# Patient Record
Sex: Female | Born: 1975
Health system: Southern US, Community
[De-identification: ages and names within clinical notes are randomized; demographics above are authoritative.]

## PROBLEM LIST (undated history)

## (undated) DIAGNOSIS — R42 Dizziness and giddiness: Secondary | ICD-10-CM

---

## 2014-06-15 ENCOUNTER — Other Ambulatory Visit (HOSPITAL_COMMUNITY)
Admission: RE | Admit: 2014-06-15 | Discharge: 2014-06-15 | Disposition: A | Discharge status: Home / Self Care | Payer: BC Managed Care – PPO | Source: Ambulatory Visit | Attending: Family Medicine | Admitting: Family Medicine

## 2014-06-15 DIAGNOSIS — Z01411 Encounter for gynecological examination (general) (routine) with abnormal findings: Secondary | ICD-10-CM | POA: Insufficient documentation

## 2014-06-15 DIAGNOSIS — Z1151 Encounter for screening for human papillomavirus (HPV): Secondary | ICD-10-CM | POA: Diagnosis present

## 2017-05-24 ENCOUNTER — Emergency Department (HOSPITAL_COMMUNITY): Payer: BC Managed Care – PPO

## 2017-05-24 ENCOUNTER — Emergency Department (HOSPITAL_COMMUNITY)
Admission: EM | Admit: 2017-05-24 | Discharge: 2017-05-24 | Disposition: A | Discharge status: Home / Self Care | Payer: BC Managed Care – PPO | Attending: Emergency Medicine | Admitting: Emergency Medicine

## 2017-05-24 ENCOUNTER — Encounter (HOSPITAL_COMMUNITY): Payer: Self-pay | Admitting: *Deleted

## 2017-05-24 DIAGNOSIS — R112 Nausea with vomiting, unspecified: Secondary | ICD-10-CM | POA: Diagnosis not present

## 2017-05-24 DIAGNOSIS — R197 Diarrhea, unspecified: Secondary | ICD-10-CM | POA: Insufficient documentation

## 2017-05-24 DIAGNOSIS — R1030 Lower abdominal pain, unspecified: Secondary | ICD-10-CM | POA: Diagnosis present

## 2017-05-24 HISTORY — DX: Dizziness and giddiness: R42

## 2017-05-24 LAB — URINALYSIS, ROUTINE W REFLEX MICROSCOPIC
BILIRUBIN URINE: NEGATIVE
Glucose, UA: NEGATIVE mg/dL
HGB URINE DIPSTICK: NEGATIVE
Ketones, ur: 20 mg/dL — AB
Leukocytes, UA: NEGATIVE
Nitrite: NEGATIVE
Protein, ur: NEGATIVE mg/dL
SPECIFIC GRAVITY, URINE: 1.03 (ref 1.005–1.030)
pH: 7 (ref 5.0–8.0)

## 2017-05-24 LAB — CBC
HCT: 41.2 % (ref 36.0–46.0)
HEMOGLOBIN: 14.2 g/dL (ref 12.0–15.0)
MCH: 30.9 pg (ref 26.0–34.0)
MCHC: 34.5 g/dL (ref 30.0–36.0)
MCV: 89.8 fL (ref 78.0–100.0)
Platelets: 275 10*3/uL (ref 150–400)
RBC: 4.59 MIL/uL (ref 3.87–5.11)
RDW: 13 % (ref 11.5–15.5)
WBC: 6.5 10*3/uL (ref 4.0–10.5)

## 2017-05-24 LAB — COMPREHENSIVE METABOLIC PANEL
ALK PHOS: 56 U/L (ref 38–126)
ALT: 21 U/L (ref 14–54)
ANION GAP: 9 (ref 5–15)
AST: 21 U/L (ref 15–41)
Albumin: 4.1 g/dL (ref 3.5–5.0)
BUN: 6 mg/dL (ref 6–20)
CALCIUM: 9 mg/dL (ref 8.9–10.3)
CO2: 25 mmol/L (ref 22–32)
Chloride: 103 mmol/L (ref 101–111)
Creatinine, Ser: 0.95 mg/dL (ref 0.44–1.00)
GFR calc Af Amer: >60 mL/min (ref >60)
GFR calc non Af Amer: >60 mL/min (ref >60)
GLUCOSE: 108 mg/dL — AB (ref 65–99)
Potassium: 3.4 mmol/L — ABNORMAL LOW (ref 3.5–5.1)
SODIUM: 137 mmol/L (ref 135–145)
Total Bilirubin: 0.6 mg/dL (ref 0.3–1.2)
Total Protein: 7.1 g/dL (ref 6.5–8.1)

## 2017-05-24 LAB — RAPID URINE DRUG SCREEN, HOSP PERFORMED
AMPHETAMINES: NOT DETECTED
BARBITURATES: NOT DETECTED
BENZODIAZEPINES: NOT DETECTED
Cocaine: NOT DETECTED
Opiates: NOT DETECTED
Tetrahydrocannabinol: NOT DETECTED

## 2017-05-24 LAB — WET PREP, GENITAL
Sperm: NONE SEEN
Trich, Wet Prep: NONE SEEN
YEAST WET PREP: NONE SEEN

## 2017-05-24 LAB — I-STAT BETA HCG BLOOD, ED (MC, WL, AP ONLY)

## 2017-05-24 LAB — LIPASE, BLOOD: Lipase: 34 U/L (ref 11–51)

## 2017-05-24 MED ORDER — DICYCLOMINE HCL 10 MG PO CAPS
10.0000 mg | ORAL_CAPSULE | Freq: Once | ORAL | Status: AC
  Administered 2017-05-24: 10 mg via ORAL
  Filled 2017-05-24: qty 1

## 2017-05-24 MED ORDER — IOPAMIDOL (ISOVUE-300) INJECTION 61%
INTRAVENOUS | Status: AC
Start: 2017-05-24 — End: 2017-05-24
  Administered 2017-05-24: 100 mL
  Filled 2017-05-24: qty 100

## 2017-05-24 MED ORDER — SODIUM CHLORIDE 0.9 % IV SOLN: 1000.0000 mL | INTRAVENOUS | Status: DC

## 2017-05-24 MED ORDER — DICYCLOMINE HCL 20 MG PO TABS: 20.0000 mg | ORAL_TABLET | Freq: Two times a day (BID) | ORAL | 0 refills | Status: AC

## 2017-05-24 MED ORDER — SODIUM CHLORIDE 0.9 % IV BOLUS (SEPSIS)
1000.0000 mL | Freq: Once | INTRAVENOUS | Status: AC
  Administered 2017-05-24: 1000 mL via INTRAVENOUS

## 2017-05-24 MED ORDER — PROMETHAZINE HCL 25 MG/ML IJ SOLN
25.0000 mg | Freq: Once | INTRAMUSCULAR | Status: AC
  Administered 2017-05-24: 25 mg via INTRAVENOUS
  Filled 2017-05-24: qty 1

## 2017-05-24 MED ORDER — ONDANSETRON 4 MG PO TBDP
4.0000 mg | ORAL_TABLET | Freq: Once | ORAL | Status: AC
  Administered 2017-05-24: 4 mg via ORAL
  Filled 2017-05-24: qty 1

## 2017-05-24 MED ORDER — KETOROLAC TROMETHAMINE 30 MG/ML IJ SOLN
30.0000 mg | Freq: Once | INTRAMUSCULAR | Status: AC
  Administered 2017-05-24: 30 mg via INTRAVENOUS
  Filled 2017-05-24: qty 1

## 2017-05-24 MED ORDER — METOCLOPRAMIDE HCL 10 MG PO TABS: 10.0000 mg | ORAL_TABLET | Freq: Three times a day (TID) | ORAL | 0 refills | Status: AC

## 2017-05-24 NOTE — ED Notes (Addendum)
Pt removing clothing for pelvic exam.  Pelvic cart set up outside of room.

## 2017-05-24 NOTE — ED Triage Notes (Signed)
Pt reports lower abdominal pain that has been ongoing since Monday. Pt states that pain is intermittent. Pt states that she has had N/V/D as well.

## 2017-05-24 NOTE — ED Notes (Signed)
Pt reports 4 episodes of vomiting today and no episodes of diarrhea in the last 24 hours.

## 2017-05-24 NOTE — ED Provider Notes (Signed)
[facility] EMERGENCY DEPARTMENT Provider Note   CSN: [id number] Arrival date & time: 05/24/17  4098     History   Chief Complaint Chief Complaint  Patient presents with  . Abdominal Pain    HPI Danielle Allison is a 42 y.o. female.  HPI Patient has had 3 days of lower abdominal pain waxing and waning severity.  She reports when it comes on is intense and stabbing with cramping.  She reports it seems somewhat better in the morning and then by evening she is having severe pain.  She has had 2 episodes of loose stool.  She has had ongoing nausea.  She reports since last night she is vomited multiple times.  She reports this happened once about a month ago but resolved spontaneously over time.  She did not seek treatment at that time.  Denies any pain burning urgency with urination.  She reports she is currently having a menstrual cycle.  It is normal mental cycle she denies other vaginal discharge.  No fevers no chills.  No sick contacts.  No recent travel.  Pain is central and suprapubic. Past Medical History:  Diagnosis Date  . Vertigo     There are no active problems to display for this patient.   History reviewed. No pertinent surgical history.  OB History    No data available       Home Medications    Prior to Admission medications   Medication Sig Start Date End Date Taking? Authorizing Provider  dicyclomine (BENTYL) 20 MG tablet Take 1 tablet (20 mg total) by mouth 2 (two) times daily. 05/24/17   Arby Barrette, MD  metoCLOPramide (REGLAN) 10 MG tablet Take 1 tablet (10 mg total) by mouth 3 (three) times daily before meals. 05/24/17   Arby Barrette, MD    Family History No family history on file.  Social History Social History   Tobacco Use  . Smoking status: Not on file  Substance Use Topics  . Alcohol use: Not on file  . Drug use: Not on file     Allergies   Patient has no known allergies.   Review of Systems Review of Systems 10  Systems reviewed and are negative for acute change except as noted in the HPI.   Physical Exam Updated Vital Signs BP (!) 138/91   Pulse 83   Temp 98.1 F (36.7 C)   Resp 17   LMP 05/18/2017   SpO2 99%   Physical Exam  Constitutional: She is oriented to person, place, and time.  Awake, alert, nontoxic appearance with baseline speech for patient.  HENT:  Head: Normocephalic and atraumatic.  Mouth/Throat: Oropharynx is clear and moist. No oropharyngeal exudate.  Eyes: EOM are normal. Pupils are equal, round, and reactive to light. Right eye exhibits no discharge. Left eye exhibits no discharge.  Neck: Neck supple.  Cardiovascular: Normal rate and regular rhythm.  No murmur heard. Pulmonary/Chest: Effort normal and breath sounds normal. No stridor. No respiratory distress. She has no wheezes. She has no rales. She exhibits no tenderness.  Abdominal: Soft. Bowel sounds are normal. She exhibits no distension and no mass. There is tenderness. There is no rebound.  Mild suprapubic discomfort to palpation.  No guarding.  No palpable mass.  Upper abdomen nontender.  Genitourinary: Vagina normal.  Genitourinary Comments: No discharge in the vaginal vault.  Cervix is nontender.  No pelvic tenderness to palpation.  Musculoskeletal: Normal range of motion. She exhibits no tenderness.  Baseline ROM,  moves extremities with no obvious new focal weakness.  Lymphadenopathy:    She has no cervical adenopathy.  Neurological: She is alert and oriented to person, place, and time. No cranial nerve deficit. She exhibits normal muscle tone. Coordination normal.  Skin: No rash noted.  Psychiatric: She has a normal mood and affect.  Nursing note and vitals reviewed.    ED Treatments / Results  Labs (all labs ordered are listed, but only abnormal results are displayed) Labs Reviewed  COMPREHENSIVE METABOLIC PANEL - Abnormal; Notable for the following components:      Result Value   Potassium 3.4 (*)     Glucose, Bld 108 (*)    All other components within normal limits  URINALYSIS, ROUTINE W REFLEX MICROSCOPIC - Abnormal; Notable for the following components:   Ketones, ur 20 (*)    All other components within normal limits  WET PREP, GENITAL  LIPASE, BLOOD  CBC  RAPID URINE DRUG SCREEN, HOSP PERFORMED  I-STAT BETA HCG BLOOD, ED ([facility], [facility], [facility] ONLY)  GC/CHLAMYDIA PROBE AMP ([place]) NOT AT [facility]    EKG  EKG Interpretation None       Radiology Ct Abdomen Pelvis W Contrast  Result Date: 05/24/2017 CLINICAL DATA:  Lower abdominal pain, 4 days duration. Pain is intermittent. Some nausea, vomiting and diarrhea. EXAM: CT ABDOMEN AND PELVIS WITH CONTRAST TECHNIQUE: Multidetector CT imaging of the abdomen and pelvis was performed using the standard protocol following bolus administration of intravenous contrast. CONTRAST:  ISOVUE-300 IOPAMIDOL (ISOVUE-300) INJECTION 61% COMPARISON:  None. FINDINGS: Lower chest: Normal Hepatobiliary: Liver parenchyma is normal.  No calcified gallstones. Pancreas: Normal Spleen: Normal Adrenals/Urinary Tract: Adrenal glands are normal. Kidneys are normal. No cyst, mass, stone or hydronephrosis. Collecting system contrast on the initial imaging would obscure small stones. Bladder appears normal. Stomach/Bowel: No abnormal bowel finding.  Normal appendix. Vascular/Lymphatic: Aorta and IVC are normal. No retroperitoneal adenopathy. Reproductive: Uterus and adnexal regions are normal. Other: No free fluid or air. Musculoskeletal: Normal IMPRESSION: Normal CT scan of the abdomen and pelvis. No abnormality seen to explain the presenting symptoms. Electronically Signed   By: Paulina Fusi M.D.   On: 05/24/2017 11:40    Procedures Procedures (including critical care time)  Medications Ordered in ED Medications  sodium chloride 0.9 % bolus 1,000 mL (0 mLs Intravenous Stopped 05/24/17 1303)    Followed by  sodium chloride 0.9 % bolus 1,000 mL (0 mLs Intravenous  Stopped 05/24/17 1303)    Followed by  0.9 %  sodium chloride infusion (not administered)  ondansetron (ZOFRAN-ODT) disintegrating tablet 4 mg (4 mg Oral Given 05/24/17 0809)  ketorolac (TORADOL) 30 MG/ML injection 30 mg (30 mg Intravenous Given 05/24/17 1041)  promethazine (PHENERGAN) injection 25 mg (25 mg Intravenous Given 05/24/17 1041)  iopamidol (ISOVUE-300) 61 % injection (100 mLs  Contrast Given 05/24/17 1100)  dicyclomine (BENTYL) capsule 10 mg (10 mg Oral Given 05/24/17 1442)     Initial Impression / Assessment and Plan / ED Course  I have reviewed the triage vital signs and the nursing notes.  Pertinent labs & imaging results that were available during my care of the patient were reviewed by me and considered in my medical decision making (see chart for details).     Final Clinical Impressions(s) / ED Diagnoses   Final diagnoses:  Lower abdominal pain  Non-intractable vomiting with nausea, unspecified vomiting type  Diagnostic workup is within normal limits.  Patient has had recurrence of lower abdominal cramping pain  and nausea and vomiting.  Clinically she is well in appearance.  CT and diagnostic studies do not show anomaly.  Consideration is given to possible irritable bowel.  Patient reports the Phenergan and Zofran that her provider had given her last time this happened has not been helpful.  We will have her trial Reglan and Bentyl for symptoms.  Recommend follow-up with her PCP and possibly gastroenterology.  ED Discharge Orders        Ordered    dicyclomine (BENTYL) 20 MG tablet  2 times daily     05/24/17 1604    metoCLOPramide (REGLAN) 10 MG tablet  3 times daily before meals     05/24/17 1604       Arby BarrettePfeiffer, Ariyel Jeangilles, MD 05/24/17 1610

## 2017-05-24 NOTE — ED Notes (Signed)
Patient transported to CT 

## 2017-05-24 NOTE — ED Notes (Signed)
Dr. Pfeiffer at bedside   

## 2017-05-24 NOTE — ED Notes (Signed)
Pt verbalized understanding of d/c instructions and has no further questions. VSS. NAD. Pt to follow up with pcp and possibly GI.

## 2017-05-24 NOTE — ED Notes (Signed)
Pt taken to room via wheelchair by this RN.

## 2017-05-24 NOTE — ED Notes (Signed)
Pt called out in pain. Dr. Donnald GarrePfeiffer notified.

## 2017-05-25 LAB — GC/CHLAMYDIA PROBE AMP (~~LOC~~) NOT AT ARMC
Chlamydia: NEGATIVE
Neisseria Gonorrhea: NEGATIVE

## 2017-07-11 ENCOUNTER — Other Ambulatory Visit (HOSPITAL_COMMUNITY)
Admission: RE | Admit: 2017-07-11 | Discharge: 2017-07-11 | Disposition: A | Discharge status: Home / Self Care | Payer: BC Managed Care – PPO | Source: Ambulatory Visit | Attending: Family Medicine | Admitting: Family Medicine

## 2017-07-11 ENCOUNTER — Other Ambulatory Visit: Payer: Self-pay | Admitting: Family Medicine

## 2017-07-11 DIAGNOSIS — Z124 Encounter for screening for malignant neoplasm of cervix: Secondary | ICD-10-CM | POA: Diagnosis not present

## 2017-07-13 LAB — CYTOLOGY - PAP
Diagnosis: NEGATIVE
HPV: NOT DETECTED

## 2018-12-03 ENCOUNTER — Ambulatory Visit: Payer: Self-pay | Admitting: *Deleted

## 2018-12-03 NOTE — Telephone Encounter (Signed)
My son has autism and a therapist comes in to work with him.   We just found out she is positive. I'm having symptoms and so is my husband. My doctor told me to call this number.   I let her know we needed an order from her doctor and we would call her and make an appt.    She goes to [facility].   I let her know that since they are not on our computer system that he could call in the order to [place] and we could take it over the phone and then get her set up.   She wants to all be tested at the same time if possible.   Her children go to a pediatrician who has ordered the test.  She is going to call her doctor now  And have him call in the order to [place].   She was agreeable to this.

## 2019-03-13 ENCOUNTER — Other Ambulatory Visit: Payer: Self-pay

## 2019-03-13 DIAGNOSIS — Z20822 Contact with and (suspected) exposure to covid-19: Secondary | ICD-10-CM

## 2019-03-15 LAB — NOVEL CORONAVIRUS, NAA: SARS-CoV-2, NAA: NOT DETECTED

## 2019-06-03 ENCOUNTER — Ambulatory Visit: Payer: BC Managed Care – PPO | Attending: Internal Medicine

## 2019-06-03 DIAGNOSIS — Z20822 Contact with and (suspected) exposure to covid-19: Secondary | ICD-10-CM

## 2019-06-04 ENCOUNTER — Other Ambulatory Visit: Payer: Self-pay

## 2019-06-05 ENCOUNTER — Other Ambulatory Visit: Payer: Self-pay

## 2019-06-05 LAB — NOVEL CORONAVIRUS, NAA: SARS-CoV-2, NAA: NOT DETECTED

## 2019-06-13 IMAGING — CT CT ABD-PELV W/ CM
2 of 5 series · 16 of 46 positions shown, 18 images · IV contrast (APPLIED)
Comparison: None.

CLINICAL DATA: Lower abdominal pain, 4 days duration. Pain is
intermittent. Some nausea, vomiting and diarrhea.

EXAM:
CT ABDOMEN AND PELVIS WITH CONTRAST
TECHNIQUE: Multidetector CT imaging of the abdomen and pelvis was performed
using the standard protocol following bolus administration of
intravenous contrast.
CONTRAST:  100mL BSVDEP-QII IOPAMIDOL (BSVDEP-QII) INJECTION 61%

[Series 3: abd/ pelvis 5.0 i30f 2 · axial · 0.77mm/px · z∈[+682,+1112]mm · 13 of 98 slices shown, 15 images]
[im 6/98  soft-tissue]
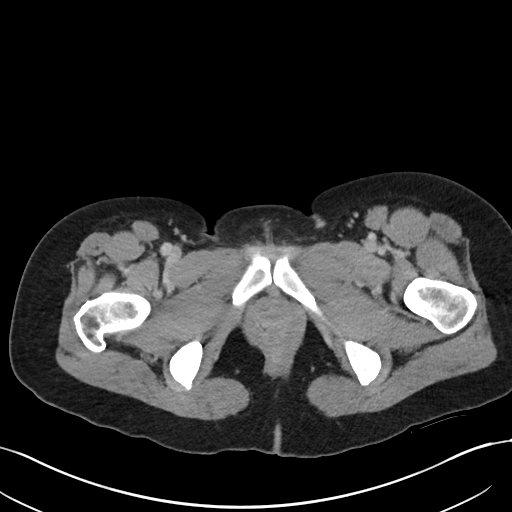
[im 6/98  bone]
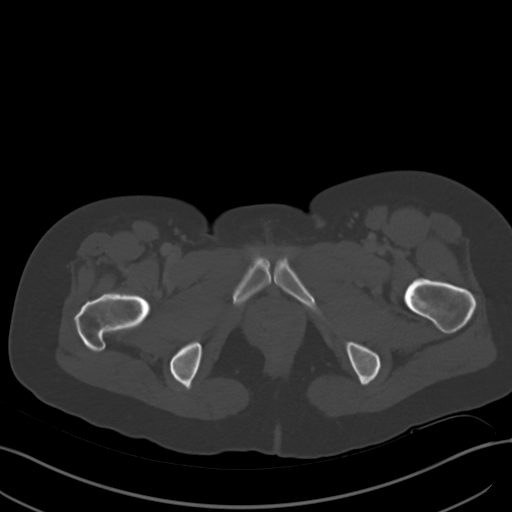
[im 16/98  soft-tissue]
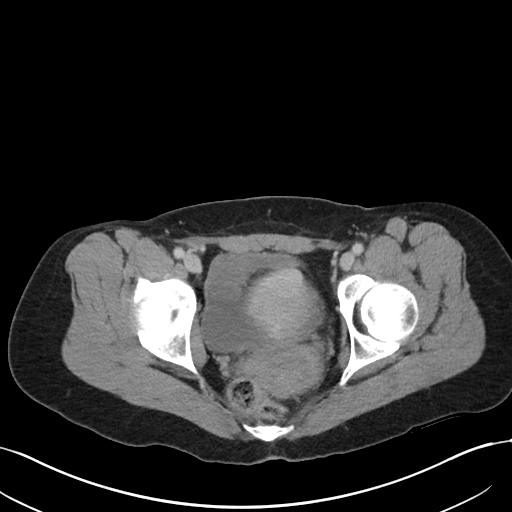
[im 21/98  soft-tissue]
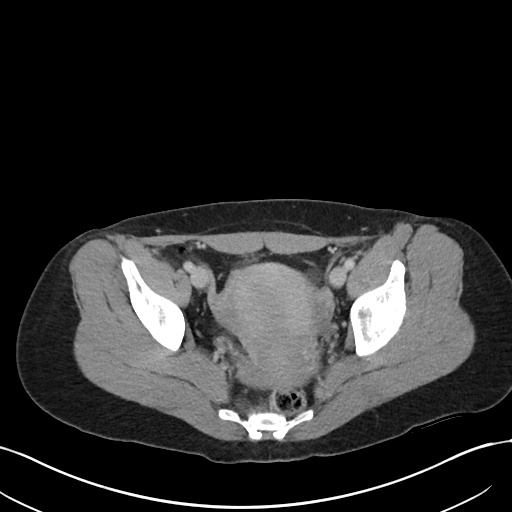
[im 26/98  soft-tissue]
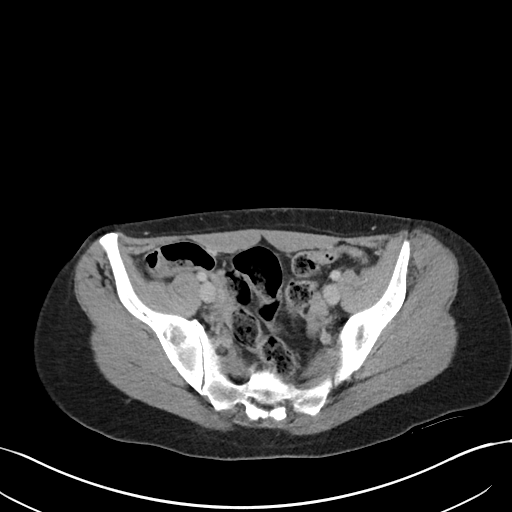
[im 36/98  soft-tissue]
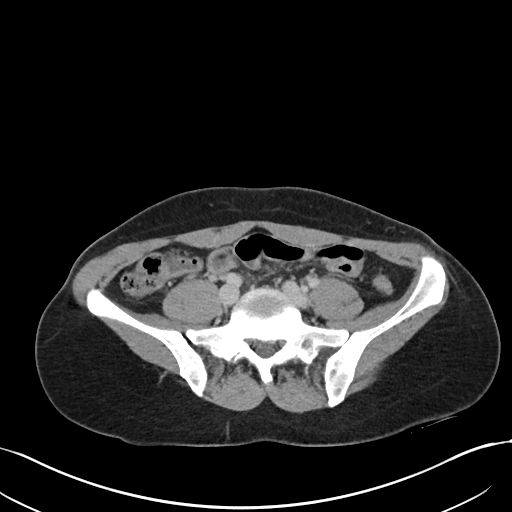
[im 41/98  soft-tissue]
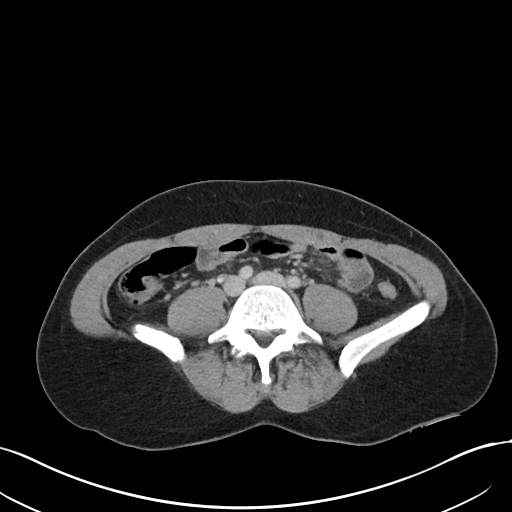
[im 52/98  soft-tissue]
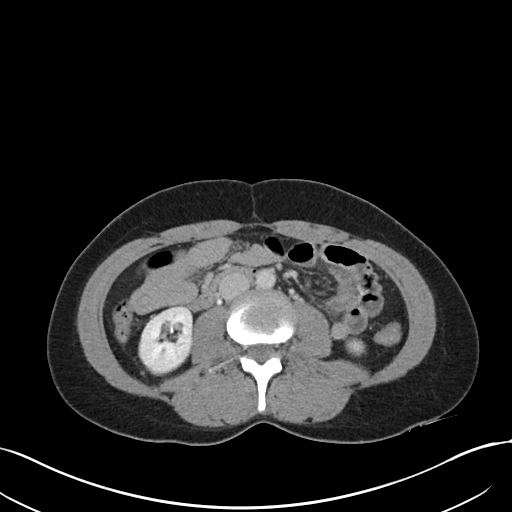
[im 57/98  soft-tissue]
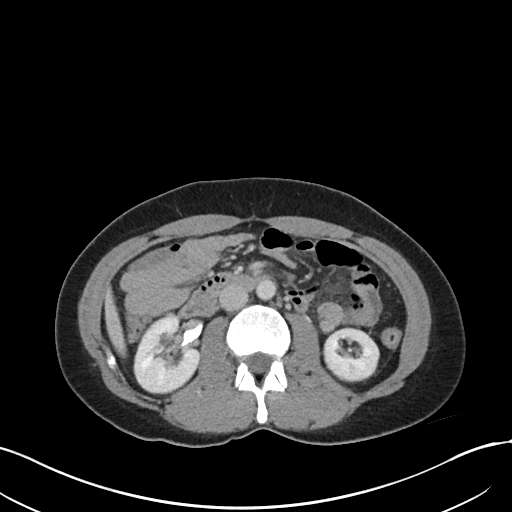
[im 62/98  soft-tissue]
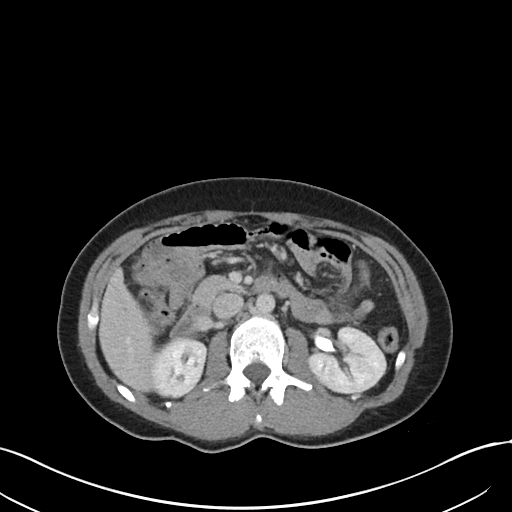
[im 62/98  bone]
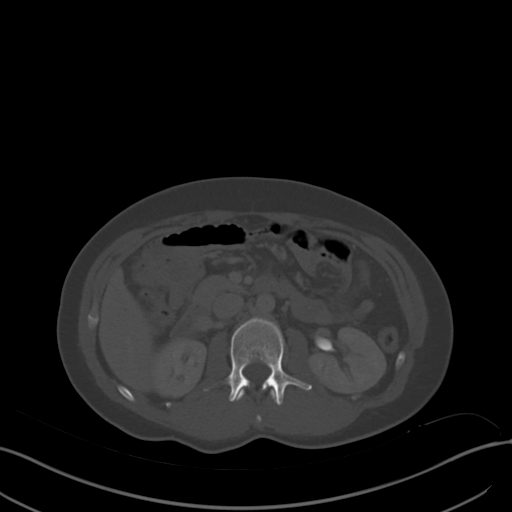
[im 72/98  soft-tissue]
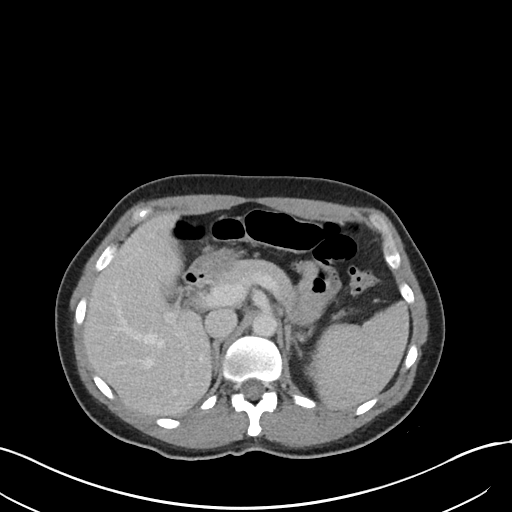
[im 77/98  soft-tissue]
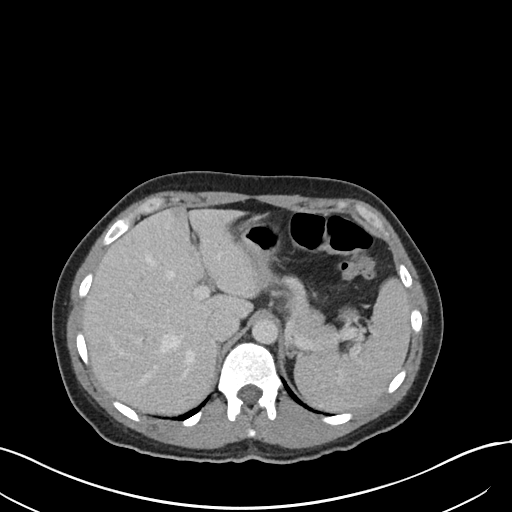
[im 82/98  soft-tissue]
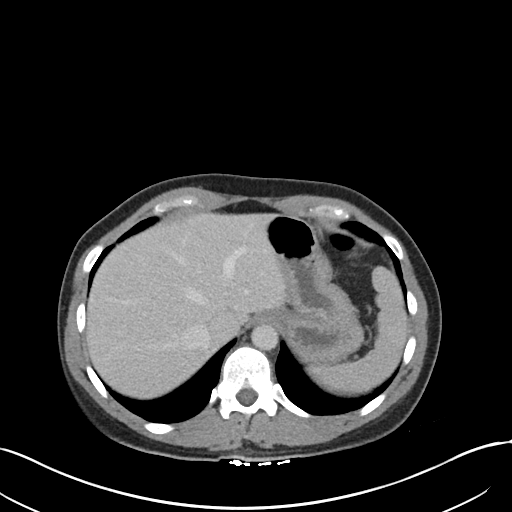
[im 92/98  soft-tissue]
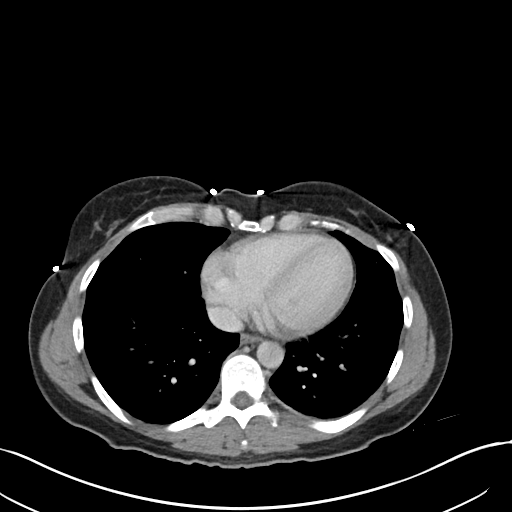

[Series 6: coronal soft tissue · coronal · 0.72mm/px · 3 of 80 slices shown]
[im 27/80  soft-tissue]
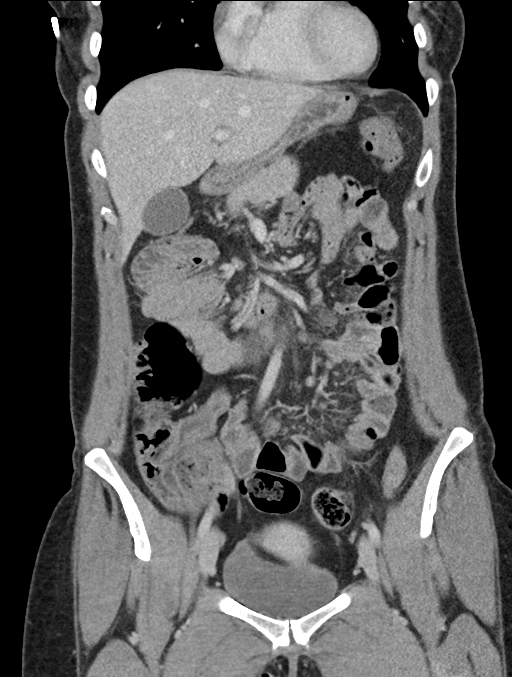
[im 36/80  soft-tissue]
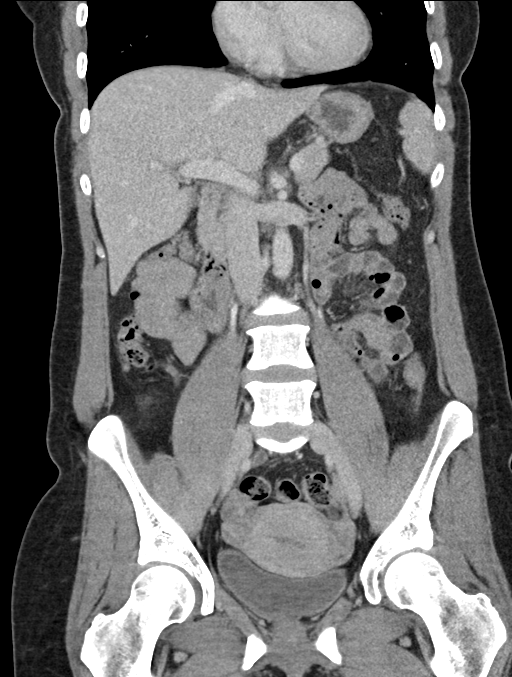
[im 44/80  soft-tissue]
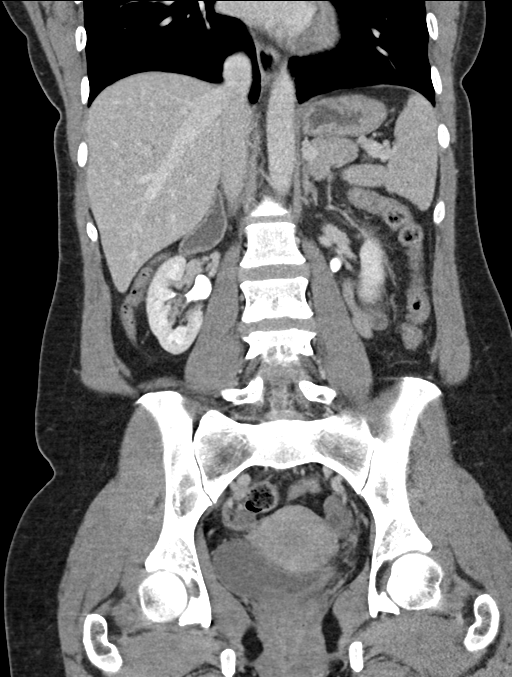

[16 of 46 positions shown; findings below may reference images not displayed]

FINDINGS: Lower chest: Normal

Hepatobiliary: Liver parenchyma is normal.  No calcified gallstones.

Pancreas: Normal

Spleen: Normal

Adrenals/Urinary Tract: Adrenal glands are normal. Kidneys are
normal. No cyst, mass, stone or hydronephrosis. Collecting system
contrast on the initial imaging would obscure small stones. Bladder
appears normal.

Stomach/Bowel: No abnormal bowel finding.  Normal appendix.

Vascular/Lymphatic: Aorta and IVC are normal. No retroperitoneal
adenopathy.

Reproductive: Uterus and adnexal regions are normal.

Other: No free fluid or air.

Musculoskeletal: Normal
IMPRESSION: Normal CT scan of the abdomen and pelvis. No abnormality seen to
explain the presenting symptoms.
